# Patient Record
Sex: Male | Born: 2000 | Race: White | Hispanic: No | Marital: Single | Smoking: Current some day smoker
Health system: Southern US, Community
[De-identification: ages and names within clinical notes are randomized; demographics above are authoritative.]

---
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
## 2021-04-05 ENCOUNTER — Inpatient Hospital Stay (HOSPITAL_COMMUNITY)
Admission: EM | Admit: 2021-04-05 | Discharge: 2021-04-10 | DRG: 089 | Disposition: A | Discharge status: Home Health | Payer: No Typology Code available for payment source | Attending: Surgery | Admitting: Surgery

## 2021-04-05 ENCOUNTER — Inpatient Hospital Stay (HOSPITAL_COMMUNITY): Payer: No Typology Code available for payment source

## 2021-04-05 ENCOUNTER — Emergency Department (HOSPITAL_COMMUNITY): Payer: No Typology Code available for payment source

## 2021-04-05 DIAGNOSIS — T1490XA Injury, unspecified, initial encounter: Secondary | ICD-10-CM

## 2021-04-05 DIAGNOSIS — S3210XA Unspecified fracture of sacrum, initial encounter for closed fracture: Secondary | ICD-10-CM

## 2021-04-05 DIAGNOSIS — S329XXA Fracture of unspecified parts of lumbosacral spine and pelvis, initial encounter for closed fracture: Secondary | ICD-10-CM | POA: Diagnosis present

## 2021-04-05 DIAGNOSIS — S22069A Unspecified fracture of T7-T8 vertebra, initial encounter for closed fracture: Secondary | ICD-10-CM | POA: Diagnosis present

## 2021-04-05 DIAGNOSIS — R410 Disorientation, unspecified: Principal | ICD-10-CM

## 2021-04-05 DIAGNOSIS — S32810A Multiple fractures of pelvis with stable disruption of pelvic ring, initial encounter for closed fracture: Secondary | ICD-10-CM

## 2021-04-05 DIAGNOSIS — S2222XA Fracture of body of sternum, initial encounter for closed fracture: Secondary | ICD-10-CM

## 2021-04-05 DIAGNOSIS — T148XXA Other injury of unspecified body region, initial encounter: Secondary | ICD-10-CM

## 2021-04-05 DIAGNOSIS — S22009A Unspecified fracture of unspecified thoracic vertebra, initial encounter for closed fracture: Secondary | ICD-10-CM

## 2021-04-05 DIAGNOSIS — S32119A Unspecified Zone I fracture of sacrum, initial encounter for closed fracture: Secondary | ICD-10-CM | POA: Diagnosis present

## 2021-04-05 DIAGNOSIS — D62 Acute posthemorrhagic anemia: Secondary | ICD-10-CM | POA: Diagnosis not present

## 2021-04-05 DIAGNOSIS — S32511A Fracture of superior rim of right pubis, initial encounter for closed fracture: Secondary | ICD-10-CM | POA: Diagnosis present

## 2021-04-05 DIAGNOSIS — S060X9A Concussion with loss of consciousness of unspecified duration, initial encounter: Principal | ICD-10-CM | POA: Diagnosis present

## 2021-04-05 DIAGNOSIS — Y92410 Unspecified street and highway as the place of occurrence of the external cause: Secondary | ICD-10-CM

## 2021-04-05 DIAGNOSIS — R339 Retention of urine, unspecified: Secondary | ICD-10-CM | POA: Diagnosis not present

## 2021-04-05 DIAGNOSIS — Z20822 Contact with and (suspected) exposure to covid-19: Secondary | ICD-10-CM | POA: Diagnosis present

## 2021-04-05 DIAGNOSIS — N179 Acute kidney failure, unspecified: Secondary | ICD-10-CM | POA: Diagnosis present

## 2021-04-05 DIAGNOSIS — F17210 Nicotine dependence, cigarettes, uncomplicated: Secondary | ICD-10-CM | POA: Diagnosis present

## 2021-04-05 DIAGNOSIS — F1410 Cocaine abuse, uncomplicated: Secondary | ICD-10-CM | POA: Diagnosis present

## 2021-04-05 DIAGNOSIS — S322XXA Fracture of coccyx, initial encounter for closed fracture: Secondary | ICD-10-CM

## 2021-04-05 LAB — I-STAT CHEM 8, ED
BUN: 9 mg/dL (ref 6–20)
Calcium, Ion: 0.98 mmol/L — ABNORMAL LOW (ref 1.15–1.40)
Chloride: 101 mmol/L (ref 98–111)
Creatinine, Ser: 1.3 mg/dL — ABNORMAL HIGH (ref 0.61–1.24)
Glucose, Bld: 155 mg/dL — ABNORMAL HIGH (ref 70–99)
HCT: 47 % (ref 39.0–52.0)
Hemoglobin: 16 g/dL (ref 13.0–17.0)
Potassium: 3.7 mmol/L (ref 3.5–5.1)
Sodium: 136 mmol/L (ref 135–145)
TCO2: 23 mmol/L (ref 22–32)

## 2021-04-05 LAB — I-STAT ARTERIAL BLOOD GAS, ED
Acid-Base Excess: 2 mmol/L (ref 0.0–2.0)
Acid-Base Excess: 0 mmol/L (ref 0.0–2.0)
Bicarbonate: 23.8 mmol/L (ref 20.0–28.0)
Bicarbonate: 22.6 mmol/L (ref 20.0–28.0)
Calcium, Ion: 1.12 mmol/L — ABNORMAL LOW (ref 1.15–1.40)
Calcium, Ion: 1.17 mmol/L (ref 1.15–1.40)
HCT: 33 % — ABNORMAL LOW (ref 39.0–52.0)
HCT: 33 % — ABNORMAL LOW (ref 39.0–52.0)
Hemoglobin: 11.2 g/dL — ABNORMAL LOW (ref 13.0–17.0)
Hemoglobin: 11.2 g/dL — ABNORMAL LOW (ref 13.0–17.0)
O2 Saturation: 100 %
O2 Saturation: 100 %
Patient temperature: 98.3
Patient temperature: 98.3
Potassium: 3.8 mmol/L (ref 3.5–5.1)
Potassium: 3.9 mmol/L (ref 3.5–5.1)
Sodium: 137 mmol/L (ref 135–145)
Sodium: 138 mmol/L (ref 135–145)
TCO2: 25 mmol/L (ref 22–32)
TCO2: 24 mmol/L (ref 22–32)
pCO2 arterial: 27 mmHg — ABNORMAL LOW (ref 32.0–48.0)
pCO2 arterial: 30.1 mmHg — ABNORMAL LOW (ref 32.0–48.0)
pH, Arterial: 7.553 — ABNORMAL HIGH (ref 7.350–7.450)
pH, Arterial: 7.483 — ABNORMAL HIGH (ref 7.350–7.450)
pO2, Arterial: 546 mmHg — ABNORMAL HIGH (ref 83.0–108.0)
pO2, Arterial: 202 mmHg — ABNORMAL HIGH (ref 83.0–108.0)

## 2021-04-05 LAB — COMPREHENSIVE METABOLIC PANEL
ALT: 32 U/L (ref 0–44)
AST: 60 U/L — ABNORMAL HIGH (ref 15–41)
Albumin: 4 g/dL (ref 3.5–5.0)
Alkaline Phosphatase: 108 U/L (ref 38–126)
Anion gap: 10 (ref 5–15)
BUN: 8 mg/dL (ref 6–20)
CO2: 25 mmol/L (ref 22–32)
Calcium: 9.5 mg/dL (ref 8.9–10.3)
Chloride: 99 mmol/L (ref 98–111)
Creatinine, Ser: 1.33 mg/dL — ABNORMAL HIGH (ref 0.61–1.24)
GFR, Estimated: >60 mL/min (ref >60)
Glucose, Bld: 159 mg/dL — ABNORMAL HIGH (ref 70–99)
Potassium: 3.3 mmol/L — ABNORMAL LOW (ref 3.5–5.1)
Sodium: 134 mmol/L — ABNORMAL LOW (ref 135–145)
Total Bilirubin: 1 mg/dL (ref 0.3–1.2)
Total Protein: 7.5 g/dL (ref 6.5–8.1)

## 2021-04-05 LAB — URINALYSIS, ROUTINE W REFLEX MICROSCOPIC
Bilirubin Urine: NEGATIVE
Glucose, UA: NEGATIVE mg/dL
Ketones, ur: NEGATIVE mg/dL
Leukocytes,Ua: NEGATIVE
Nitrite: NEGATIVE
Protein, ur: 30 mg/dL — AB
RBC / HPF: >50 RBC/hpf — ABNORMAL HIGH (ref 0–5)
Specific Gravity, Urine: 1.033 — ABNORMAL HIGH (ref 1.005–1.030)
pH: 6 (ref 5.0–8.0)

## 2021-04-05 LAB — PROTIME-INR
INR: 1.2 (ref 0.8–1.2)
Prothrombin Time: 14.5 seconds (ref 11.4–15.2)

## 2021-04-05 LAB — SAMPLE TO BLOOD BANK

## 2021-04-05 LAB — CBC
HCT: 48.3 % (ref 39.0–52.0)
Hemoglobin: 15.9 g/dL (ref 13.0–17.0)
MCH: 29.8 pg (ref 26.0–34.0)
MCHC: 32.9 g/dL (ref 30.0–36.0)
MCV: 90.6 fL (ref 80.0–100.0)
Platelets: 236 10*3/uL (ref 150–400)
RBC: 5.33 MIL/uL (ref 4.22–5.81)
RDW: 13.1 % (ref 11.5–15.5)
WBC: 14.9 10*3/uL — ABNORMAL HIGH (ref 4.0–10.5)
nRBC: 0 % (ref 0.0–0.2)

## 2021-04-05 LAB — RESP PANEL BY RT-PCR (FLU A&B, COVID) ARPGX2
Influenza A by PCR: NEGATIVE
Influenza B by PCR: NEGATIVE
SARS Coronavirus 2 by RT PCR: NEGATIVE

## 2021-04-05 LAB — LACTIC ACID, PLASMA: Lactic Acid, Venous: 2 mmol/L (ref 0.5–1.9)

## 2021-04-05 LAB — ETHANOL: Alcohol, Ethyl (B): <10 mg/dL (ref <10)

## 2021-04-05 LAB — HIV ANTIBODY (ROUTINE TESTING W REFLEX): HIV Screen 4th Generation wRfx: NONREACTIVE

## 2021-04-05 MED ORDER — MORPHINE SULFATE (PF) 2 MG/ML IV SOLN
2.0000 mg | INTRAVENOUS | Status: DC | PRN
Start: 2021-04-05 — End: 2021-04-10
  Administered 2021-04-06: 2 mg via INTRAVENOUS
  Filled 2021-04-05: qty 1

## 2021-04-05 MED ORDER — MIDAZOLAM HCL 2 MG/2ML IJ SOLN
INTRAMUSCULAR | Status: AC
  Administered 2021-04-05: 2 mg via INTRAVENOUS
  Filled 2021-04-05: qty 2

## 2021-04-05 MED ORDER — BISACODYL 10 MG RE SUPP: 10.0000 mg | Freq: Every day | RECTAL | Status: DC | PRN

## 2021-04-05 MED ORDER — DOCUSATE SODIUM 100 MG PO CAPS
100.0000 mg | ORAL_CAPSULE | Freq: Two times a day (BID) | ORAL | Status: DC
  Administered 2021-04-06 – 2021-04-10 (×8): 100 mg via ORAL
  Filled 2021-04-05 (×8): qty 1

## 2021-04-05 MED ORDER — IOHEXOL 300 MG/ML  SOLN
100.0000 mL | Freq: Once | INTRAMUSCULAR | Status: AC | PRN
  Administered 2021-04-05: 100 mL via INTRAVENOUS

## 2021-04-05 MED ORDER — ONDANSETRON HCL 4 MG/2ML IJ SOLN
4.0000 mg | Freq: Four times a day (QID) | INTRAMUSCULAR | Status: DC | PRN
Start: 2021-04-05 — End: 2021-04-10

## 2021-04-05 MED ORDER — FENTANYL BOLUS VIA INFUSION
50.0000 ug | INTRAVENOUS | Status: DC | PRN
  Administered 2021-04-06: 50 ug via INTRAVENOUS
  Administered 2021-04-06: 100 ug via INTRAVENOUS
  Filled 2021-04-05: qty 100

## 2021-04-05 MED ORDER — ONDANSETRON 4 MG PO TBDP: 4.0000 mg | ORAL_TABLET | Freq: Four times a day (QID) | ORAL | Status: DC | PRN

## 2021-04-05 MED ORDER — OXYCODONE HCL 5 MG PO TABS
5.0000 mg | ORAL_TABLET | ORAL | Status: DC | PRN
  Administered 2021-04-06: 10 mg via ORAL
  Administered 2021-04-06: 5 mg via ORAL
  Administered 2021-04-07 – 2021-04-09 (×10): 10 mg via ORAL
  Administered 2021-04-09: 5 mg via ORAL
  Administered 2021-04-09 – 2021-04-10 (×3): 10 mg via ORAL
  Filled 2021-04-05: qty 2
  Filled 2021-04-05: qty 1
  Filled 2021-04-05: qty 2
  Filled 2021-04-05: qty 1
  Filled 2021-04-05 (×13): qty 2

## 2021-04-05 MED ORDER — FENTANYL CITRATE (PF) 100 MCG/2ML IJ SOLN
50.0000 ug | Freq: Once | INTRAMUSCULAR | Status: AC
  Administered 2021-04-05: 50 ug via INTRAVENOUS

## 2021-04-05 MED ORDER — METHOCARBAMOL 500 MG PO TABS
750.0000 mg | ORAL_TABLET | Freq: Three times a day (TID) | ORAL | Status: DC | PRN
  Administered 2021-04-07 – 2021-04-08 (×3): 750 mg via ORAL
  Filled 2021-04-05 (×3): qty 2

## 2021-04-05 MED ORDER — ACETAMINOPHEN 500 MG PO TABS
1000.0000 mg | ORAL_TABLET | Freq: Three times a day (TID) | ORAL | Status: DC
  Administered 2021-04-06 – 2021-04-10 (×11): 1000 mg via ORAL
  Filled 2021-04-05 (×12): qty 2

## 2021-04-05 MED ORDER — HYDRALAZINE HCL 20 MG/ML IJ SOLN: 10.0000 mg | INTRAMUSCULAR | Status: DC | PRN

## 2021-04-05 MED ORDER — ETOMIDATE 2 MG/ML IV SOLN
INTRAVENOUS | Status: AC | PRN
  Administered 2021-04-05: 10 mg via INTRAVENOUS

## 2021-04-05 MED ORDER — SUCCINYLCHOLINE CHLORIDE 20 MG/ML IJ SOLN
INTRAMUSCULAR | Status: AC | PRN
  Administered 2021-04-05: 100 mg via INTRAVENOUS

## 2021-04-05 MED ORDER — IOHEXOL 300 MG/ML  SOLN
100.0000 mL | Freq: Once | INTRAMUSCULAR | Status: AC | PRN
  Administered 2021-04-05: 100 mL

## 2021-04-05 MED ORDER — SODIUM CHLORIDE 0.9 % IV BOLUS
1000.0000 mL | Freq: Once | INTRAVENOUS | Status: AC
  Administered 2021-04-05: 1000 mL via INTRAVENOUS

## 2021-04-05 MED ORDER — ENOXAPARIN SODIUM 30 MG/0.3ML ~~LOC~~ SOLN
30.0000 mg | Freq: Two times a day (BID) | SUBCUTANEOUS | Status: DC
  Administered 2021-04-07 – 2021-04-10 (×7): 30 mg via SUBCUTANEOUS
  Filled 2021-04-05 (×6): qty 0.3

## 2021-04-05 MED ORDER — SODIUM CHLORIDE 0.9 % IV SOLN: INTRAVENOUS | Status: DC

## 2021-04-05 MED ORDER — FENTANYL 2500MCG IN NS 250ML (10MCG/ML) PREMIX INFUSION
50.0000 ug/h | INTRAVENOUS | Status: DC
  Administered 2021-04-05: 150 ug/h via INTRAVENOUS
  Filled 2021-04-05: qty 250

## 2021-04-05 MED ORDER — MIDAZOLAM HCL 2 MG/2ML IJ SOLN: 2.0000 mg | Freq: Once | INTRAMUSCULAR | Status: AC

## 2021-04-05 MED ORDER — PROPOFOL 1000 MG/100ML IV EMUL
5.0000 ug/kg/min | INTRAVENOUS | Status: DC
  Administered 2021-04-05: 10 ug/kg/min via INTRAVENOUS
  Administered 2021-04-06: 40 ug/kg/min via INTRAVENOUS
  Filled 2021-04-05 (×2): qty 100

## 2021-04-05 NOTE — ED Notes (Addendum)
Patient transported back from CT 
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
## 2021-04-05 NOTE — ED Notes (Signed)
Patient transported to CT 
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
## 2021-04-05 NOTE — Procedures (Signed)
Intubation Procedure Note  Fernando White  [id number]  03/20/2001  Date:04/05/21  Time:6:18 PM   Provider Performing:Redonna Wilbert N Dawit Tankard    Procedure: Intubation (31500)  Indication(s) Respiratory Failure  Consent Risks of the procedure as well as the alternatives and risks of each were explained to the patient and/or caregiver.  Consent for the procedure was obtained and is signed in the bedside chart   Anesthesia Etomidate and Succinylcholine   Time Out Verified patient identification, verified procedure, site/side was marked, verified correct patient position, special equipment/implants available, medications/allergies/relevant history reviewed, required imaging and test results available.   Sterile Technique Usual hand hygeine, masks, and gloves were used   Procedure Description Patient positioned in bed supine.  Sedation given as noted above.  Patient was intubated with endotracheal tube using Glidescope.  View was Grade 1 full glottis .  Number of attempts was 1.  Colorimetric CO2 detector was consistent with tracheal placement.   Complications/Tolerance None; patient tolerated the procedure well. Chest X-ray is ordered to verify placement.   EBL Minimal   Specimen(s) None

## 2021-04-05 NOTE — ED Notes (Signed)
Patient transported to CT via stretcher on monitor with RN

## 2021-04-05 NOTE — ED Notes (Signed)
Bladder scan: 

## 2021-04-05 NOTE — Progress Notes (Signed)
Patient transported to 4N24 w/o complications. Report given to unit RRT
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
## 2021-04-05 NOTE — ED Notes (Signed)
Fernando White mother [phone]
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
## 2021-04-05 NOTE — ED Notes (Signed)
RT at bedside for suction and ABG

## 2021-04-05 NOTE — ED Notes (Signed)
Pt continues to make attempts to get out of bed.  Repetitive questioning related to water.  PT's mouth has been swabbed. Pt has been reminded to remain lying down and keep his arm straight, that he has pelvic and spine fx and needs his fluids to run. EDP states he has concussion which Is cause of confusion.

## 2021-04-06 LAB — BASIC METABOLIC PANEL
Anion gap: 6 (ref 5–15)
BUN: 7 mg/dL (ref 6–20)
CO2: 21 mmol/L — ABNORMAL LOW (ref 22–32)
Calcium: 7.9 mg/dL — ABNORMAL LOW (ref 8.9–10.3)
Chloride: 108 mmol/L (ref 98–111)
Creatinine, Ser: 1.18 mg/dL (ref 0.61–1.24)
GFR, Estimated: >60 mL/min (ref >60)
Glucose, Bld: 90 mg/dL (ref 70–99)
Potassium: 3.5 mmol/L (ref 3.5–5.1)
Sodium: 135 mmol/L (ref 135–145)

## 2021-04-06 LAB — RAPID URINE DRUG SCREEN, HOSP PERFORMED
Amphetamines: NOT DETECTED
Barbiturates: NOT DETECTED
Benzodiazepines: POSITIVE — AB
Cocaine: POSITIVE — AB
Opiates: NOT DETECTED
Tetrahydrocannabinol: POSITIVE — AB

## 2021-04-06 LAB — CBC
HCT: 31.2 % — ABNORMAL LOW (ref 39.0–52.0)
Hemoglobin: 10.7 g/dL — ABNORMAL LOW (ref 13.0–17.0)
MCH: 30.5 pg (ref 26.0–34.0)
MCHC: 34.3 g/dL (ref 30.0–36.0)
MCV: 88.9 fL (ref 80.0–100.0)
Platelets: 125 10*3/uL — ABNORMAL LOW (ref 150–400)
RBC: 3.51 MIL/uL — ABNORMAL LOW (ref 4.22–5.81)
RDW: 13.3 % (ref 11.5–15.5)
WBC: 7.7 10*3/uL (ref 4.0–10.5)
nRBC: 0 % (ref 0.0–0.2)

## 2021-04-06 LAB — TRIGLYCERIDES: Triglycerides: 96 mg/dL (ref <150)

## 2021-04-06 LAB — MRSA PCR SCREENING: MRSA by PCR: NEGATIVE

## 2021-04-06 MED ORDER — CHLORHEXIDINE GLUCONATE CLOTH 2 % EX PADS
6.0000 | MEDICATED_PAD | Freq: Every day | CUTANEOUS | Status: DC
  Administered 2021-04-09: 6 via TOPICAL
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
## 2021-04-06 NOTE — Progress Notes (Signed)
Foley removed this morning around 0900. Pt tried several times to use the urinal but was unsuccessful. He was bladder scanned at 1845 and showed 670 mL.  He was I/O'd and of urine was returned.

## 2021-04-06 NOTE — Progress Notes (Signed)
Pt only belonging on admission to 4N24 is a pair of underwear.

## 2021-04-06 NOTE — Procedures (Signed)
Extubation Procedure Note  Patient Details:   Name: Fernando White DOB: 03/20/2001 MRN: [id number]   Airway Documentation:    Vent end date: 04/06/21 Vent end time: 0925   Evaluation  O2 sats: stable throughout Complications: No apparent complications Patient did tolerate procedure well. Bilateral Breath Sounds: Clear,Diminished   Pt extubated to 2L [place] per MD order. Pt had positive cuff leak prior to extubation. No stridor noted.  Guss Bunde 04/06/2021, 9:26 AM
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
## 2021-04-06 NOTE — Progress Notes (Signed)
SLP Cancellation Note  Patient Details Name: Fernando White MRN: [id number] DOB: 03/20/2001   Cancelled treatment:       Reason Eval/Treat Not Completed: Patient not medically ready. Will f/u when appropriate.    Silena Wyss, Riley Nearing 04/06/2021, 8:05 AM

## 2021-04-07 MED ORDER — BETHANECHOL CHLORIDE 25 MG PO TABS
25.0000 mg | ORAL_TABLET | Freq: Three times a day (TID) | ORAL | Status: DC
  Administered 2021-04-07 – 2021-04-10 (×10): 25 mg via ORAL
  Filled 2021-04-07 (×10): qty 1

## 2021-04-07 MED ORDER — POLYETHYLENE GLYCOL 3350 17 G PO PACK
17.0000 g | PACK | Freq: Every day | ORAL | Status: DC
  Administered 2021-04-07 – 2021-04-08 (×2): 17 g via ORAL
  Filled 2021-04-07: qty 1

## 2021-04-07 MED ORDER — TAMSULOSIN HCL 0.4 MG PO CAPS
0.4000 mg | ORAL_CAPSULE | Freq: Every day | ORAL | Status: DC
  Administered 2021-04-07 – 2021-04-10 (×4): 0.4 mg via ORAL
  Filled 2021-04-07 (×4): qty 1

## 2021-04-07 NOTE — Progress Notes (Signed)
Inpatient Rehab Admissions Coordinator Note:   Per therapy recommendations, pt was screened for CIR candidacy by Estill Dooms, PT, DPT.  At this time we are recommending a rehab consult and I will place an order per our protocol.  Please contact me with questions.   Estill Dooms, PT, DPT [phone] 04/07/21 9:47 AM
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
## 2021-04-07 NOTE — Progress Notes (Signed)
Patient received via bed with R.N.  alert and orin. Orin. To room .Placed on cardiac monitor.
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
## 2021-04-07 NOTE — Progress Notes (Signed)
Inpatient Rehabilitation Admissions Coordinator  Inpatient rehab consult received. I met with patient at bedside to discuss goals, cost and expectations of a possible CIR admit. I have recommended he contact his employer, [employer], to inquire into possible Medical insurance coverage since he has worked there since October 2021. He is  A good candidate for rehab. If he has insurance, I will have to begin Auth when that payor is provided. I will follow up in the morning.  Danne Baxter, RN, MSN Rehab Admissions Coordinator [phone] 04/07/2021 3:39 PM

## 2021-04-08 ENCOUNTER — Inpatient Hospital Stay (HOSPITAL_COMMUNITY): Payer: No Typology Code available for payment source

## 2021-04-08 LAB — BASIC METABOLIC PANEL
Anion gap: 6 (ref 5–15)
BUN: <5 mg/dL — ABNORMAL LOW (ref 6–20)
CO2: 26 mmol/L (ref 22–32)
Calcium: 8.1 mg/dL — ABNORMAL LOW (ref 8.9–10.3)
Chloride: 103 mmol/L (ref 98–111)
Creatinine, Ser: 0.82 mg/dL (ref 0.61–1.24)
GFR, Estimated: >60 mL/min (ref >60)
Glucose, Bld: 86 mg/dL (ref 70–99)
Potassium: 4 mmol/L (ref 3.5–5.1)
Sodium: 135 mmol/L (ref 135–145)

## 2021-04-08 LAB — CBC
HCT: 26.3 % — ABNORMAL LOW (ref 39.0–52.0)
Hemoglobin: 8.8 g/dL — ABNORMAL LOW (ref 13.0–17.0)
MCH: 29.9 pg (ref 26.0–34.0)
MCHC: 33.5 g/dL (ref 30.0–36.0)
MCV: 89.5 fL (ref 80.0–100.0)
Platelets: 101 10*3/uL — ABNORMAL LOW (ref 150–400)
RBC: 2.94 MIL/uL — ABNORMAL LOW (ref 4.22–5.81)
RDW: 13.2 % (ref 11.5–15.5)
WBC: 7.4 10*3/uL (ref 4.0–10.5)
nRBC: 0 % (ref 0.0–0.2)

## 2021-04-08 LAB — VITAMIN D 25 HYDROXY (VIT D DEFICIENCY, FRACTURES): Vit D, 25-Hydroxy: 9.6 ng/mL — ABNORMAL LOW (ref 30–100)

## 2021-04-08 MED ORDER — SODIUM CHLORIDE 0.9% FLUSH
3.0000 mL | Freq: Two times a day (BID) | INTRAVENOUS | Status: DC
  Administered 2021-04-08 – 2021-04-10 (×3): 3 mL via INTRAVENOUS

## 2021-04-08 MED ORDER — SODIUM CHLORIDE 0.9 % IV SOLN: 250.0000 mL | INTRAVENOUS | Status: DC | PRN

## 2021-04-08 MED ORDER — SODIUM CHLORIDE 0.9% FLUSH: 3.0000 mL | INTRAVENOUS | Status: DC | PRN

## 2021-04-08 MED ORDER — METHOCARBAMOL 500 MG PO TABS
1000.0000 mg | ORAL_TABLET | Freq: Four times a day (QID) | ORAL | Status: DC
  Administered 2021-04-08 – 2021-04-10 (×7): 1000 mg via ORAL
  Filled 2021-04-08 (×7): qty 2
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
## 2021-04-08 NOTE — TOC Progression Note (Signed)
Transition of Care [facility]) - Progression Note    Patient Details  Name: Fernando White MRN: [id number] Date of Birth: 24-Aug-2001  [facility] of [facility] CM/SW Contact  Ella Bodo, RN Phone Number: 04/08/2021, 1:36 PM  Clinical Narrative: Met with pt, mother and PT at bedside. Anticipate that patient will be able to discharge home with mother, who can provide 24/7 assistance.  Family has RW and BSC, and are looking for shower seat.  Will follow up with pt/mom regarding additional home needs.  May need OP therapy follow up, depending on progress.        Expected Discharge Plan: OP Rehab Barriers to Discharge: Continued Medical Work up  Expected Discharge Plan and Services Expected Discharge Plan: OP Rehab   Discharge Planning Services: CM Consult   Living arrangements for the past 2 months: Single Family Home                                       Social Determinants of Health (SDOH) Interventions    Readmission Risk Interventions No flowsheet data found.  Reinaldo Raddle, RN, BSN  Trauma/Neuro ICU Case Manager [phone]

## 2021-04-08 NOTE — Progress Notes (Signed)
Inpatient Rehabilitation Admissions Coordinator  I met with patient with his Mom at bedside. I discussed goals, expectations and cost of care of a possible CIR admit. Patent plans to stay with his Mom at d/c and she can provide 24/7 care. He is progressing well with therapy and they are hopeful that he will not require a CIR admit with the subsequent cost of care. I have discussed with Dr. Naaman Plummer, and patient felt likely be able to progress home over the next few days. I have alerted acute team and TOC. We will follow his progress over the next 24 hrs.  Danne Baxter, RN, MSN Rehab Admissions Coordinator [phone] 04/08/2021 12:23 PM

## 2021-04-08 NOTE — Discharge Instructions (Addendum)
Indwelling Urinary Catheter Care, Adult An indwelling urinary catheter is a thin tube that is put into your bladder. The tube helps to drain pee (urine) out of your body. The tube goes in through your urethra. Your urethra is where pee comes out of your body. Your pee will come out through the catheter, then it will go into a bag (drainage bag). Take good care of your catheter so it will work well. How to wear your catheter and bag Supplies needed Sticky tape (adhesive tape) or a leg strap. Alcohol wipe or soap and water (if you use tape). A clean towel (if you use tape). Large overnight bag. Smaller bag (leg bag). Wearing your catheter Attach your catheter to your leg with tape or a leg strap. Make sure the catheter is not pulled tight. If a leg strap gets wet, take it off and put on a dry strap. If you use tape to hold the bag on your leg: Use an alcohol wipe or soap and water to wash your skin where the tape made it sticky before. Use a clean towel to pat-dry that skin. Use new tape to make the bag stay on your leg. Wearing your bags You should have been given a large overnight bag. You may wear the overnight bag in the day or night. Always have the overnight bag lower than your bladder.  Do not let the bag touch the floor. Before you go to sleep, put a clean plastic bag in a wastebasket. Then hang the overnight bag inside the wastebasket. You should also have a smaller leg bag that fits under your clothes. Always wear the leg bag below your knee. Do not wear your leg bag at night. How to care for your skin and catheter Supplies needed A clean washcloth. Water and mild soap. A clean towel. Caring for your skin and catheter Clean the skin around your catheter every day: Wash your hands with soap and water. Wet a clean washcloth in warm water and mild soap. Clean the skin around your urethra. If you are male: Gently spread the folds of skin around your vagina (labia). With  the washcloth in your other hand, wipe the inner side of your labia on each side. Wipe from front to back. If you are male: Pull back any skin that covers the end of your penis (foreskin). With the washcloth in your other hand, wipe your penis in small circles. Start wiping at the tip of your penis, then move away from the catheter. Move the foreskin back in place, if needed. With your free hand, hold the catheter close to where it goes into your body. Keep holding the catheter during cleaning so it does not get pulled out. With the washcloth in your other hand, clean the catheter. Only wipe downward on the catheter. Do not wipe upward toward your body. Doing this may push germs into your urethra and cause infection. Use a clean towel to pat-dry the catheter and the skin around it. Make sure to wipe off all soap. Wash your hands with soap and water. Shower every day. Do not take baths. Do not use cream, ointment, or lotion on the area where the catheter goes into your body, unless your doctor tells you to. Do not use powders, sprays, or lotions on your genital area. Check your skin around the catheter every day for signs of infection. Check for: Redness, swelling, or pain. Fluid or blood. Warmth. Pus or a bad smell.  How to empty the bag Supplies needed Rubbing alcohol. Gauze pad or cotton ball. Tape or a leg strap. Emptying the bag Pour the pee out of your bag when it is ?- full, or at least 2-3 times a day. Do this for your overnight bag and your leg bag. Wash your hands with soap and water. Separate (detach) the bag from your leg. Hold the bag over the toilet or a clean pail. Keep the bag lower than your hips and bladder. This is so the pee (urine) does not go back into the tube. Open the pour spout. It is at the bottom of the bag. Empty the pee into the toilet or pail. Do not let the pour spout touch any surface. Put rubbing alcohol on a gauze pad or cotton ball. Use the  gauze pad or cotton ball to clean the pour spout. Close the pour spout. Attach the bag to your leg with tape or a leg strap. Wash your hands with soap and water. Follow instructions for cleaning the drainage bag: From the product maker. As told by your doctor. How to change the bag Supplies needed Alcohol wipes. A clean bag. Tape or a leg strap. Changing the bag Replace your bag when it starts to leak, smell bad, or look dirty. Wash your hands with soap and water. Separate the dirty bag from your leg. Pinch the catheter with your fingers so that pee does not spill out. Separate the catheter tube from the bag tube where these tubes connect (at the connection valve). Do not let the tubes touch any surface. Clean the end of the catheter tube with an alcohol wipe. Use a different alcohol wipe to clean the end of the bag tube. Connect the catheter tube to the tube of the clean bag. Attach the clean bag to your leg with tape or a leg strap. Do not make the bag tight on your leg. Wash your hands with soap and water. General rules Never pull on your catheter. Never try to take it out. Doing that can hurt you. Always wash your hands before and after you touch your catheter or bag. Use a mild, fragrance-free soap. If you do not have soap and water, use hand sanitizer. Always make sure there are no twists or bends (kinks) in the catheter tube. Always make sure there are no leaks in the catheter or bag. Drink enough fluid to keep your pee pale yellow. Do not take baths, swim, or use a hot tub. If you are male, wipe from front to back after you poop (have a bowel movement).   Contact a doctor if: Your pee is cloudy. Your pee smells worse than usual. Your catheter gets clogged. Your catheter leaks. Your bladder feels full. Get help right away if: You have redness, swelling, or pain where the catheter goes into your body. You have fluid, blood, pus, or a bad smell coming from the area where  the catheter goes into your body. Your skin feels warm where the catheter goes into your body. You have a fever. You have pain in your: Belly (abdomen). Legs. Lower back. Bladder. You see blood in the catheter. Your pee is pink or red. You feel sick to your stomach (nauseous). You throw up (vomit). You have chills. Your pee is not draining into the bag. Your catheter gets pulled out. Summary An indwelling urinary catheter is a thin tube that is placed into the bladder to help drain pee (urine) out of the body. The  catheter is placed into the part of the body that drains pee from the bladder (urethra). Taking good care of your catheter will keep it working properly and help prevent problems. Always wash your hands before and after touching your catheter or bag. Never pull on your catheter or try to take it out. This information is not intended to replace advice given to you by your health care provider. Make sure you discuss any questions you have with your health care provider. Document Revised: 04/05/2019 Document Reviewed: 07/28/2017 Elsevier Patient Education  2021 [employer]  Thoracic Spine Fracture A thoracic spine fracture is a break in one of the bones of the middle part of the back. The fracture can be mild or very bad. The most serious types cause the broken bones to:  Move out of place (unstable).  Damage or press on the main nerve in the spine (spinal cord). In some cases, the bone that connects to the lower part of the back may also have a break (thoracolumbar fracture). What are the causes? This condition may be caused by:  A car accident.  A fall.  A sports accident.  Violent acts. These include assaults or gunshots. What are the signs or symptoms? Symptoms may include:  Back pain.  Trouble standing or walking.  Numbness.  Tingling.  Weakness.  Loss of movement.  Being unable to control when to pee or poop (incontinence). How is this  treated? Treatment may include:  Medicines.  A cast or a brace.  Physical therapy.  Surgery. This may be needed for very bad fractures. Follow these instructions at home: Medicines  Take medicines only as told by your doctor.  Do not drive or use heavy machinery while taking pain medicine.  To prevent or treat trouble pooping (constipation) while you are taking prescription pain medicine, your doctor may recommend that you: ? Drink enough fluid to keep your pee (urine) pale yellow. ? Take over-the-counter or prescription medicines. ? Eat foods that are high in fiber. This includes fresh fruits and vegetables, whole grains, and beans. ? Limit foods that are high in fat and processed sugars. This includes fried or sweet foods. If you have a brace:  Wear the back brace as told by your doctor. Remove it only as told by your doctor.  Keep the brace clean.  If the brace is not waterproof: ? Do not let it get wet. ? Cover it with a watertight covering when you take a bath or a shower. Activity  Stay in bed (on bed rest) only as told by your doctor.  Ask your doctor what is safe for you to do.  Return to your normal activities as told by your doctor.  Do back exercises (physical therapy) as told by your doctor.  Exercise often as told by your doctor. Managing pain, stiffness, and swelling  If told, put ice on the injured area: ? Put ice in a plastic bag. ? Place a towel between your skin and the bag. ? Leave the ice on for 20 minutes, 2-3 times a day.   General instructions  Do not use any products that contain nicotine or tobacco, such as cigarettes and e-cigarettes. If you need help quitting, ask your doctor.  Do not drink alcohol.  Keep all follow-up visits as told by your doctor. This is important. Contact a doctor if:  You have a fever.  You have a cough that makes your pain worse.  Your pain medicine is not helping.  Your pain does not get better over  time.  You cannot return to your normal activities as planned. Get help right away if:  Your pain is bad and it suddenly gets worse.  You are not able to move any part of your body (paralysis) that is below the level of your injury.  You have numbness, tingling, or weakness in any part of your body that is below the level of your injury.  You cannot control when you pee (urinate) or when you poop (pass stool). Summary  A thoracic spine fracture is a break in one of the bones of the middle part of the back.  A stable fracture can be treated with a back brace, activity restrictions, pain medicine, and physical therapy. A more severe fracture may require surgery.  Make sure you know what symptoms should cause you to get help right away. This information is not intended to replace advice given to you by your health care provider. Make sure you discuss any questions you have with your health care provider. Document Revised: 01/26/2018 Document Reviewed: 01/26/2018 Elsevier Patient Education  2021 [employer]   Lumbar Spine Fracture A lumbar spine fracture is a break in one of the bones of the lower back. Lumbar spine fractures can vary from mild to severe. The most severe types are those that:  Cause the broken bones to move out of place (unstable).  Injure or press on the spinal cord. During recovery, it is normal to have pain and stiffness in the lower back for weeks. What are the causes? This condition may be caused by:  A fall.  A car accident.  A gunshot wound.  A hard, direct hit to the back. What increases the risk? You are more likely to develop this condition if:  You are in a situation that could result in a fall or other violent injury.  You have a condition that causes weakness in the bones (osteoporosis). What are the signs or symptoms? The main symptom of this condition is severe pain in the lower back. If a fracture is complex or severe, there may also  be:  A misshapen or swollen area on the lower back.  Limited ability to move an area of the lower back.  Inability to empty the bladder (urinary retention).  Loss of bowel or bladder control (incontinence).  Loss of strength or sensation in the legs, feet, and toes.  Inability to move (paralysis). How is this diagnosed? This condition is diagnosed based on:  A physical exam.  Symptoms and what happened just before they developed.  The results of imaging tests, such as an X-ray, CT scan, or MRI. If your nerves have been damaged, you may also have other tests to find out the extent of the damage. How is this treated? Treatment for this condition depends on how severe the injury is. Most fractures can be treated with:  A back brace.  Bed rest and activity restrictions.  Pain medicine.  Physical therapy. Fractures that are complex, involve multiple bones, or make the spine unstable may require surgery. Surgery is done:  To remove pressure from the nerves or spinal cord.  To stabilize the broken pieces of bone. Follow these instructions at home: Medicines  Take over-the-counter and prescription medicines only as told by your health care provider.  Do not drive or use heavy machinery while taking prescription pain medicine.  If you are taking prescription pain medicine, take actions to prevent or treat constipation. Your health  care provider may recommend that you: ? Drink enough fluid to keep your urine pale yellow. ? Eat foods that are high in fiber, such as fresh fruits and vegetables, whole grains, and beans. ? Limit foods that are high in fat and processed sugars, such as fried or sweet foods. ? Take an over-the-counter or prescription medicine for constipation. If you have a brace:  Wear the back brace as told by your health care provider. Remove it only as told by your health care provider.  Keep the brace clean.  If the brace is not waterproof: ? Do not let  it get wet. ? Cover it with a watertight covering when you take a bath or a shower. Activity  Stay in bed (on bed rest) only as directed by your health care provider.  Do exercises to improve motion and strength in your back (physical therapy), if your health care provider tells you to do so.  Return to your normal activities as directed by your health care provider. Ask your health care provider what activities are safe for you. Managing pain, stiffness, and swelling  If directed, put ice on the injured area: ? If you have a removable brace, remove it as told by your health care provider. ? Put ice in a plastic bag. ? Place a towel between your skin and the bag. ? Leave the ice on for 20 minutes, 2-3 times a day.   General instructions  Do not use any products that contain nicotine or tobacco, such as cigarettes and e-cigarettes. These can delay healing after injury. If you need help quitting, ask your health care provider.  Do not drink alcohol. Alcohol can interfere with your treatment.  Keep all follow-up visits as directed by your health care provider. This is important. ? Failing to follow up as recommended could result in permanent injury, disability, or long-lasting (chronic) pain. Contact a health care provider if:  You have a fever.  Your pain medicine is not helping.  Your pain does not get better over time.  You cannot return to your normal activities as planned or expected. Get help right away if:  You have difficulty breathing.  Your pain is very bad and it suddenly gets worse.  You have numbness, tingling, or weakness in any part of your body.  You are unable to empty your bladder.  You cannot control your bladder or bowels.  You are unable to move any body part (paralysis) that is below the level of your injury.  You vomit.  You have pain in your abdomen. Summary  A lumbar spine fracture is a break in one of the bones of the lower back.  The main  symptom of this condition is severe pain in the lower back. If a fracture is complex, there may also be numbness, tingling, or paralysis in the legs.  Treatment depends on how severe the injury is. Most fractures can be treated with a back brace, bed rest and activity restrictions, pain medicine, and physical therapy.  Fractures that are complex, involve multiple bones, or make the spine unstable may require surgery. This information is not intended to replace advice given to you by your health care provider. Make sure you discuss any questions you have with your health care provider. Document Revised: 01/27/2018 Document Reviewed: 01/27/2018 Elsevier Patient Education  2021 [employer]   Sternal Fracture  A sternal fracture is a break in the bone in the center of the chest (sternum or breastbone). This type  of fracture often causes pain that can get worse when you breathe deeply or cough. A sternal fracture is not dangerous unless there is also an injury to your heart or lungs, which are protected by the sternum and ribs. What are the causes? This condition is usually caused by a forceful injury from:  Motor vehicle accidents. This is the most common cause.  Contact sports.  Physical assaults.  Falls. You can also develop a sternal fracture without having a forceful injury if the bone becomes weakened over time (stress fracture or insufficiency fracture). What increases the risk? You are more likely to have a sternal fracture if you:  Participate in contact sports, such as football, lacrosse, wrestling, or martial arts.  Work at elevated heights, such as in Holiday representative. This increases your risk of a fall. The following factors may make you more likely to develop a stress fracture or insufficiency fracture:  Being male.  Being a postmenopausal woman.  Being 33 years of age or older.  Having weak bones (osteoporosis).  Having severe curvature of the spine.  Being on  long-term steroid treatment. What are the signs or symptoms? Symptoms of this condition include:  Pain over the sternum or chest wall.  Tenderness of the sternum or chest wall.  Pain that gets worse when you breathe deeply or cough.  Shortness of breath.  A bruise (contusion) over the chest.  Swelling.  A crackling sound when taking a deep breath or pressing on the sternum. How is this diagnosed? This condition is diagnosed based on:  A physical exam.  Your medical history.  Tests, such as: ? Blood oxygen level. This is measured with a pulse oximetry test. ? Repeated electrocardiograms (ECGs). This is to make sure that your heart is not injured. ? A blood test. This is to check for damage to your heart muscle. ? Imaging tests, such as:  A CT scan.  An ultrasound.  Chest X-rays. How is this treated? Treatment depends on the severity of your injury.  A sternal fracture without any other injury (isolated sternal fracture) usually heals without treatment. You may need to: ? Limit some activities at home. ? Take medicines for pain relief. ? Do deep breathing exercises to prevent injury and infection to your lungs.  In rare cases, surgery may be needed if a sternal fracture: ? Continues to cause severe pain. ? Causes shortness of breath or respiratory problems. ? Involves bones that have been moved too far out of position (displaced fracture). Follow these instructions at home: Managing pain, stiffness, and swelling  If directed, put ice on the injured area. ? Put ice in a plastic bag. ? Place a towel between your skin and the bag. ? Leave the ice on for 20 minutes, 2-3 times a day.   Medicines  Take over-the-counter and prescription medicines only as told by your health care provider.  Ask your health care provider if the medicine prescribed to you: ? Requires you to avoid driving or using heavy machinery. ? Can cause constipation. You may need to take actions  to prevent or treat constipation, such as:  Drink enough fluid to keep your urine pale yellow.  Take over-the-counter or prescription medicines.  Eat foods that are high in fiber, such as beans, whole grains, and fresh fruits and vegetables.  Limit foods that are high in fat and processed sugars, such as fried or sweet foods. Activity  Rest at home.  Return to your normal activities as told by  your health care provider. Ask your health care provider what activities are safe for you.  Do breathing exercises as told by your health care provider.  Do not push or pull with your arms when getting in and out of bed.  Do not lift anything that is heavier than 10 lb (4.5 kg), or the limit that you are told, until your health care provider says that it is safe. General instructions  Hug a pillow when you sneeze, cough, or twist or bend at the waist. Doing this helps support your chest.  Do not use any products that contain nicotine or tobacco, such as cigarettes, e-cigarettes, and chewing tobacco. These can delay bone healing. If you need help quitting, ask your health care provider.  Keep all follow-up visits as told by your health care provider. This is important. Contact a health care provider if:  Your pain medicine is not helping.  You continue to have pain after several weeks.  You have swelling or bruising that gets worse.  You develop a fever or chills.  You develop a cough and you cough up thick or bloody mucus from your lungs (sputum). Get help right away if you:  Have difficulty breathing.  Have chest pain.  Feel light-headed.  Have fast or irregular heartbeats (palpitations).  Feel nauseous or have pain in your abdomen. Summary  A sternal fracture is a break in the bone in the center of the chest (sternum or breastbone).  This condition is usually caused by a forceful injury. The most common cause is motor vehicle accidents.  If directed, put ice on the  injured area.  Return to your normal activities as told by your health care provider. Ask your health care provider what activities are safe for you. This information is not intended to replace advice given to you by your health care provider. Make sure you discuss any questions you have with your health care provider. Document Revised: 12/13/2018 Document Reviewed: 12/13/2018 Elsevier Patient Education  2021 [employer].

## 2021-04-09 ENCOUNTER — Encounter (HOSPITAL_COMMUNITY): Payer: Self-pay

## 2021-04-09 ENCOUNTER — Other Ambulatory Visit: Payer: Self-pay

## 2021-04-09 LAB — BASIC METABOLIC PANEL
Anion gap: 4 — ABNORMAL LOW (ref 5–15)
BUN: <5 mg/dL — ABNORMAL LOW (ref 6–20)
CO2: 30 mmol/L (ref 22–32)
Calcium: 7.9 mg/dL — ABNORMAL LOW (ref 8.9–10.3)
Chloride: 99 mmol/L (ref 98–111)
Creatinine, Ser: 0.81 mg/dL (ref 0.61–1.24)
GFR, Estimated: >60 mL/min (ref >60)
Glucose, Bld: 103 mg/dL — ABNORMAL HIGH (ref 70–99)
Potassium: 3.8 mmol/L (ref 3.5–5.1)
Sodium: 133 mmol/L — ABNORMAL LOW (ref 135–145)

## 2021-04-09 LAB — CBC
HCT: 24.1 % — ABNORMAL LOW (ref 39.0–52.0)
Hemoglobin: 8.1 g/dL — ABNORMAL LOW (ref 13.0–17.0)
MCH: 30.5 pg (ref 26.0–34.0)
MCHC: 33.6 g/dL (ref 30.0–36.0)
MCV: 90.6 fL (ref 80.0–100.0)
Platelets: 121 10*3/uL — ABNORMAL LOW (ref 150–400)
RBC: 2.66 MIL/uL — ABNORMAL LOW (ref 4.22–5.81)
RDW: 13.2 % (ref 11.5–15.5)
WBC: 7.7 10*3/uL (ref 4.0–10.5)
nRBC: 0 % (ref 0.0–0.2)

## 2021-04-09 MED ORDER — POLYETHYLENE GLYCOL 3350 17 G PO PACK
17.0000 g | PACK | Freq: Two times a day (BID) | ORAL | Status: DC
  Administered 2021-04-09 – 2021-04-10 (×2): 17 g via ORAL
  Filled 2021-04-09 (×2): qty 1
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
## 2021-04-09 NOTE — Progress Notes (Signed)
IP rehab admissions - patient is progressing well and plans are for home with [facility] therapies.  Please see note from Ottie Glazier from yesterday.  I will sign off for inpatient rehab at this time.  Call for questions.  [phone]

## 2021-04-09 NOTE — Progress Notes (Signed)
Orthopaedic Trauma Service Progress Note  Follow up xrays of pelvis show no movement of fractures post ambulation  Continue with non-op management  WBAT B LEx Unrestricted ROM R hip   Follow up with ortho in 2 weeks Expect significant improvement in symptoms around the 4-6 week mark   Fernando Latin, PA-C [phone] (C) 04/09/2021, 2:07 PM  [facility] [address] [place] [address] [phone] Fernando White (F)

## 2021-04-09 NOTE — Plan of Care (Signed)
  Problem: Clinical Measurements: Goal: Will remain free from infection Outcome: Progressing Goal: Diagnostic test results will improve Outcome: Progressing   
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
## 2021-04-10 MED ORDER — GABAPENTIN 300 MG PO CAPS: 300.0000 mg | ORAL_CAPSULE | Freq: Three times a day (TID) | ORAL | 2 refills | Status: AC

## 2021-04-10 MED ORDER — IBUPROFEN 800 MG PO TABS: 800.0000 mg | ORAL_TABLET | Freq: Three times a day (TID) | ORAL | 0 refills | Status: AC

## 2021-04-10 MED ORDER — ACETAMINOPHEN 500 MG PO TABS: 500.0000 mg | ORAL_TABLET | Freq: Four times a day (QID) | ORAL | 0 refills | Status: AC | PRN

## 2021-04-10 MED ORDER — OXYCODONE-ACETAMINOPHEN 5-325 MG PO TABS: 1.0000 | ORAL_TABLET | ORAL | 0 refills | Status: AC | PRN

## 2021-04-10 MED ORDER — BETHANECHOL CHLORIDE 25 MG PO TABS: 25.0000 mg | ORAL_TABLET | Freq: Three times a day (TID) | ORAL | 0 refills | Status: AC

## 2021-04-10 MED ORDER — TAMSULOSIN HCL 0.4 MG PO CAPS: 0.4000 mg | ORAL_CAPSULE | Freq: Every day | ORAL | 0 refills | Status: AC
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
## 2022-05-28 IMAGING — CR DG PELVIS 3+V JUDET
3 series · 3 of 3 positions shown · non-contrast
Comparison: 04/05/2021

Correlation: CT pelvis 04/05/2021

CLINICAL DATA: Pelvic fractures

EXAM:
JUDET PELVIS - 3+ VIEW

[pelvis ap]
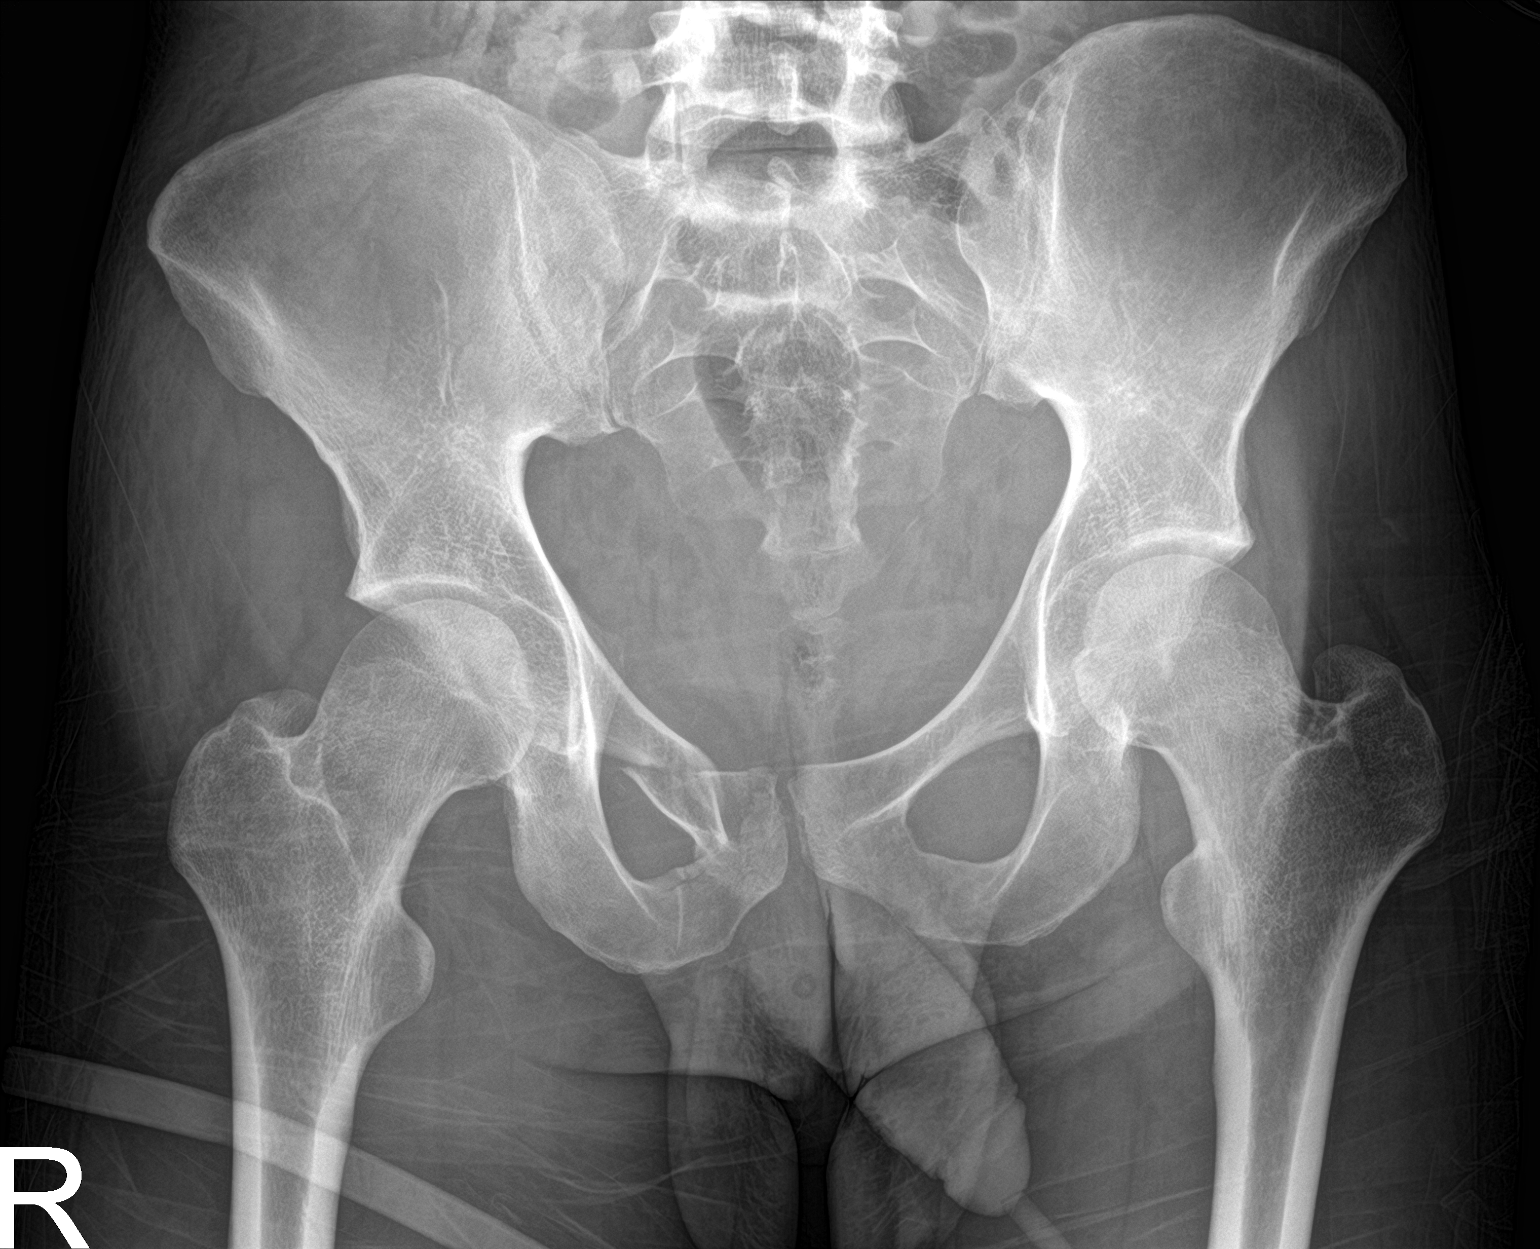

[pelvis obl (1 of 2)]
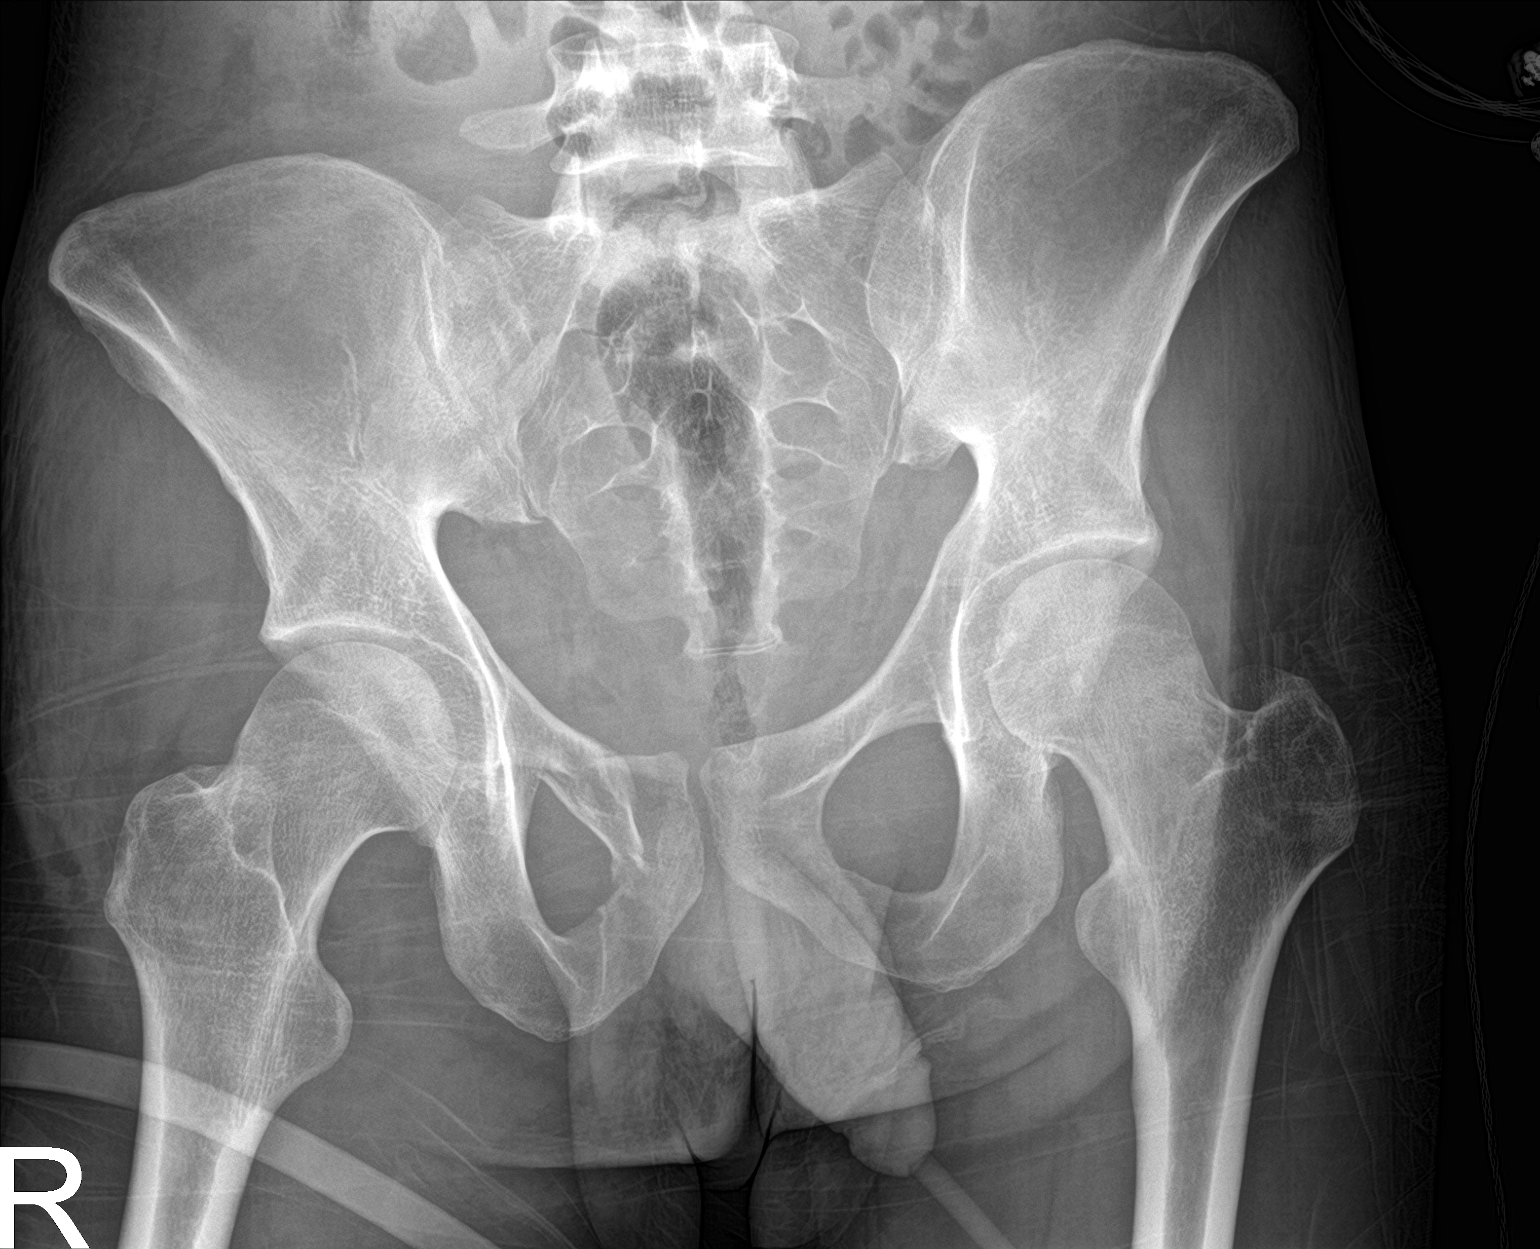

[pelvis obl (2 of 2)]
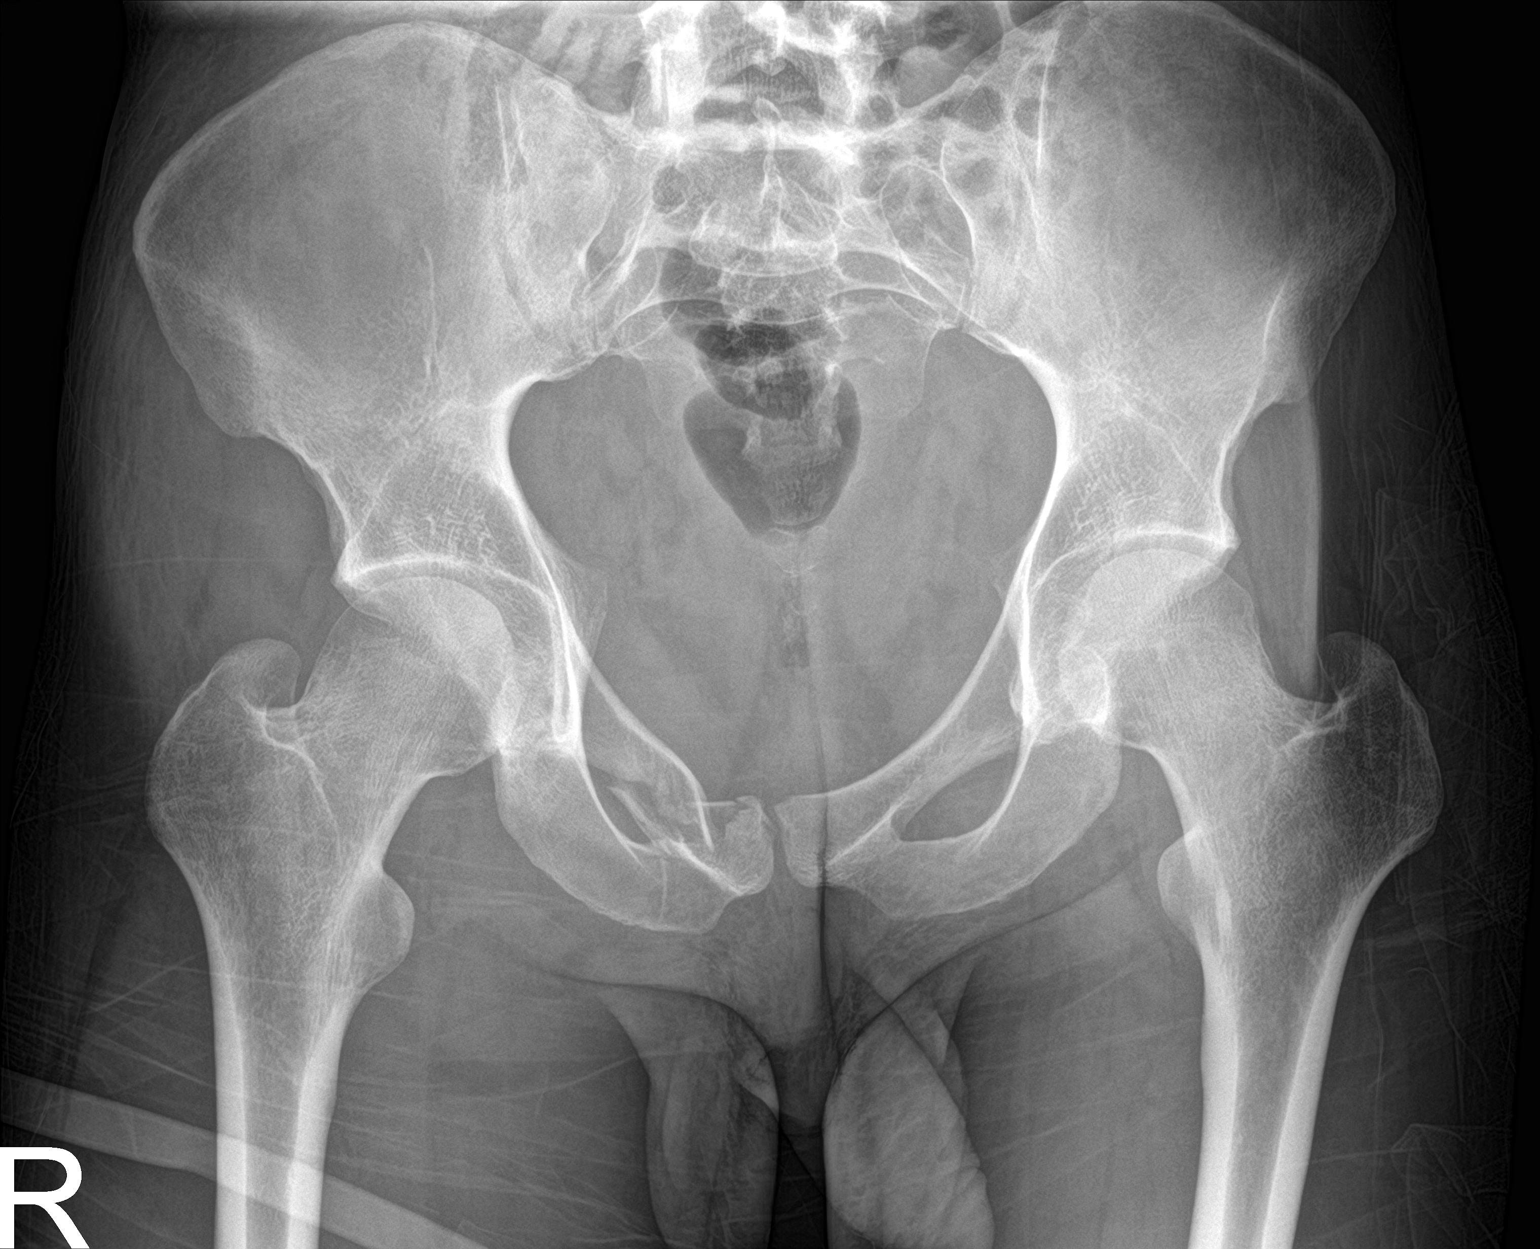

[3 of 3 positions shown; findings below may reference images not displayed]

FINDINGS: Mildly displaced fracture of the RIGHT superior pubic ramus
extending to body.

Comminuted fracture of pubic body extending into inferior pubic
ramus.

Hip and SI joint spaces preserved.

Previously identified RIGHT sacral fracture is only minimally
visualized at the margin of the RIGHT first sacral foramina.

Remaining sacral foramina symmetric.

Hips normally located.
IMPRESSION: Complex RIGHT pubic fractures, mildly displaced at superior pubic
ramus/pubic body.

RIGHT sacral fracture identified on prior CT exam is not well
visualized on radiographs.

## 2024-11-25 DEATH — deceased
